# Patient Record
Sex: Female | Born: 1995 | Race: White | Hispanic: No | Marital: Single | Smoking: Never smoker
Health system: Southern US, Community
[De-identification: ages and names within clinical notes are randomized; demographics above are authoritative.]

---
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
## 2016-08-07 ENCOUNTER — Encounter (HOSPITAL_COMMUNITY): Payer: Self-pay | Admitting: Emergency Medicine

## 2016-08-07 ENCOUNTER — Emergency Department (HOSPITAL_COMMUNITY): Payer: BC Managed Care – PPO

## 2016-08-07 ENCOUNTER — Emergency Department (HOSPITAL_COMMUNITY)
Admission: EM | Admit: 2016-08-07 | Discharge: 2016-08-07 | Disposition: A | Discharge status: Home / Self Care | Payer: BC Managed Care – PPO | Attending: Emergency Medicine | Admitting: Emergency Medicine

## 2016-08-07 DIAGNOSIS — M79662 Pain in left lower leg: Secondary | ICD-10-CM | POA: Insufficient documentation

## 2016-08-07 DIAGNOSIS — Y939 Activity, unspecified: Secondary | ICD-10-CM | POA: Diagnosis not present

## 2016-08-07 DIAGNOSIS — W108XXA Fall (on) (from) other stairs and steps, initial encounter: Secondary | ICD-10-CM | POA: Insufficient documentation

## 2016-08-07 DIAGNOSIS — Z79899 Other long term (current) drug therapy: Secondary | ICD-10-CM | POA: Diagnosis not present

## 2016-08-07 DIAGNOSIS — R0781 Pleurodynia: Secondary | ICD-10-CM | POA: Insufficient documentation

## 2016-08-07 DIAGNOSIS — Y999 Unspecified external cause status: Secondary | ICD-10-CM | POA: Diagnosis not present

## 2016-08-07 DIAGNOSIS — S161XXA Strain of muscle, fascia and tendon at neck level, initial encounter: Secondary | ICD-10-CM | POA: Insufficient documentation

## 2016-08-07 DIAGNOSIS — Y92009 Unspecified place in unspecified non-institutional (private) residence as the place of occurrence of the external cause: Secondary | ICD-10-CM | POA: Insufficient documentation

## 2016-08-07 DIAGNOSIS — T07XXXA Unspecified multiple injuries, initial encounter: Secondary | ICD-10-CM

## 2016-08-07 DIAGNOSIS — S0990XA Unspecified injury of head, initial encounter: Secondary | ICD-10-CM | POA: Diagnosis present

## 2016-08-07 MED ORDER — HYDROCODONE-ACETAMINOPHEN 5-325 MG PO TABS
1.0000 | ORAL_TABLET | Freq: Once | ORAL | Status: AC
  Administered 2016-08-07: 1 via ORAL
  Filled 2016-08-07: qty 1

## 2016-08-07 MED ORDER — DIAZEPAM 5 MG PO TABS
5.0000 mg | ORAL_TABLET | Freq: Once | ORAL | Status: AC
  Administered 2016-08-07: 5 mg via ORAL
  Filled 2016-08-07: qty 1

## 2016-08-07 MED ORDER — ONDANSETRON HCL 4 MG PO TABS
4.0000 mg | ORAL_TABLET | Freq: Once | ORAL | Status: AC
  Administered 2016-08-07: 4 mg via ORAL
  Filled 2016-08-07: qty 1

## 2016-08-07 MED ORDER — CYCLOBENZAPRINE HCL 10 MG PO TABS: 10.0000 mg | ORAL_TABLET | Freq: Three times a day (TID) | ORAL | 0 refills | Status: AC

## 2016-08-07 MED ORDER — IBUPROFEN 600 MG PO TABS: 600.0000 mg | ORAL_TABLET | Freq: Four times a day (QID) | ORAL | 0 refills | Status: AC

## 2016-08-07 NOTE — ED Notes (Signed)
Pt placed in C-collar.  Sonia QualeHobson Bryant informed.

## 2016-08-07 NOTE — ED Triage Notes (Signed)
Pt reports tripped and fell down approximately 12 stairs. Pt reports neck pain,right sided extremity pain, generalized soreness and chest discomfort with deep breath and movement. nad noted. Pt alert and oriented. Pt denies loc.

## 2016-08-07 NOTE — Discharge Instructions (Signed)
Your vital signs within normal limits. Your oxygen level is 99% on room air. The CT scan of your head is negative for any bleed or swelling. The CT scan of your neck is negative for fracture or dislocation. The x-ray of your ribs and chest are negative for fracture or injury to the lung. The x-ray of your lower leg is negative for fracture or dislocation. Please use ibuprofen with breakfast, lunch, dinner, and at bedtime. Please use Flexeril 3 times daily for spasm pain.This medication may cause drowsiness. Please do not drink, drive, or participate in activity that requires concentration while taking this medication.

## 2016-08-07 NOTE — ED Provider Notes (Signed)
[facility]EMER[facility]ENCY DEPT Provider Note   CSN: [id number] Arrival date & time: 08/07/16  1012     History   Chief Complaint Chief Complaint  Patient presents with  . Fall    HPI AYLAH YEARY is a 20 y.o. female.  Patient is a 20 year old female who presents to the emergency department following a fall.  The patient states she fell down approximately 12 steps about 8:30 this morning. She states she tripped over her dog and sustained the injury. There was no loss of consciousness. No difficulty with breathing, no double vision, no vomiting, and no hemoptysis reported. There was no loss of bowel or bladder function. The patient does complain of feeling sleepy. She complains of neck pain, right flank area pain and left leg pain. She denies being on any anticoagulation medications. She's not had any recent operations or procedures. The patient is not taken any medication for this problem up to this point.   The history is provided by the patient.    History reviewed. No pertinent past medical history.  There are no active problems to display for this patient.   History reviewed. No pertinent surgical history.  OB History    No data available       Home Medications    Prior to Admission medications   Medication Sig Start Date End Date Taking? Authorizing Provider  buPROPion (WELLBUTRIN XL) 150 MG 24 hr tablet Take 1 tablet by mouth daily. 06/11/16  Yes Historical Provider, MD  escitalopram (LEXAPRO) 10 MG tablet Take 1 tablet by mouth daily. 06/11/16  Yes Historical Provider, MD    Family History History reviewed. No pertinent family history.  Social History Social History  Substance Use Topics  . Smoking status: Never Smoker  . Smokeless tobacco: Never Used  . Alcohol use No     Allergies   Solodyn [minocycline hcl er]   Review of Systems Review of Systems  Constitutional: Negative for activity change.       All ROS Neg except as noted in HPI  HENT: Negative  for nosebleeds.   Eyes: Negative for photophobia and discharge.  Respiratory: Negative for cough, shortness of breath and wheezing.   Cardiovascular: Negative for chest pain and palpitations.  Gastrointestinal: Negative for abdominal pain and blood in stool.  Genitourinary: Negative for dysuria, frequency and hematuria.  Musculoskeletal: Negative for arthralgias, back pain and neck pain.  Skin: Negative.   Neurological: Negative for dizziness, seizures and speech difficulty.  Psychiatric/Behavioral: Negative for confusion and hallucinations.  All other systems reviewed and are negative.    Physical Exam Updated Vital Signs BP 126/67 (BP Location: Right Wrist)   Pulse 64   Temp 98 F (36.7 C) (Oral)   Resp 16   Ht 5\' 6"  (1.676 m)   Wt 127 kg   LMP 07/20/2016   SpO2 99%   BMI 45.19 kg/m   Physical Exam  Constitutional: She is oriented to person, place, and time. She appears well-developed and well-nourished.  Non-toxic appearance.  HENT:  Head: Normocephalic.  Right Ear: Tympanic membrane and external ear normal.  Left Ear: Tympanic membrane and external ear normal.  Eyes: EOM and lids are normal. Pupils are equal, round, and reactive to light.  Neck: Normal range of motion. Neck supple. Carotid bruit is not present.  Cardiovascular: Normal rate, regular rhythm, normal heart sounds, intact distal pulses and normal pulses.   Pulmonary/Chest: Breath sounds normal. No respiratory distress.  The chest rises and falls symmetrically. There is  tenderness at the lower right posterior rib area. There is no hematoma appreciated. The patient speaks in complete sentences.  Abdominal: Soft. Bowel sounds are normal. There is no tenderness. There is no guarding.  Musculoskeletal: Normal range of motion.  There is pain and soreness of the paraspinal muscles in the cervical area. There is full range of motion of both shoulders, elbows, wrist and fingers. There is full range of motion of both  hips, and knees. There is pain of the left tibial area. There is no effusion of the left knee and no swelling noted. There is good range of motion of the left ankle and toes.  Lymphadenopathy:       Head (right side): No submandibular adenopathy present.       Head (left side): No submandibular adenopathy present.    She has no cervical adenopathy.  Neurological: She is alert and oriented to person, place, and time. She has normal strength. No cranial nerve deficit or sensory deficit. She exhibits normal muscle tone. Coordination normal.  Skin: Skin is warm and dry.  Psychiatric: She has a normal mood and affect. Her speech is normal.  Nursing note and vitals reviewed.    ED Treatments / Results  Labs (all labs ordered are listed, but only abnormal results are displayed) Labs Reviewed - No data to display  EKG  EKG Interpretation None       Radiology Dg Ribs Unilateral W/chest Right  Result Date: 08/07/2016 CLINICAL DATA:  Fall down 12 steps today.  Right lower rib pain. EXAM: RIGHT RIBS AND CHEST - 3+ VIEW COMPARISON:  None. FINDINGS: Normal heart size. Normal mediastinal contour. No pneumothorax. No pleural effusion. Lungs appear clear, with no acute consolidative airspace disease and no pulmonary edema. No fracture or suspicious focal osseous lesion is seen in the right ribs. IMPRESSION: No active disease in the chest. No right rib fracture detected. Should the patient's symptoms persist or worsen, repeat radiographs of the ribs in 10 - 14 days maybe of use to detect subtle nondisplaced rib fractures (which are commonly occult on initial imaging). Electronically Signed   By: Delbert Phenix M.D.   On: 08/07/2016 12:59   Dg Tibia/fibula Left  Result Date: 08/07/2016 CLINICAL DATA:  Fall. EXAM: LEFT TIBIA AND FIBULA - 2 VIEW COMPARISON:  No recent prior. FINDINGS: No acute bony or joint abnormality. No evidence of fracture or dislocation. IMPRESSION: No acute abnormality.  Electronically Signed   By: Maisie Fus  Register   On: 08/07/2016 12:56   Ct Head Wo Contrast  Result Date: 08/07/2016 CLINICAL DATA:  fell down 10-12 carpeted stairs at home. Pt says she stepped on dogs tail and tumbled forward down stairs hitting back of head (Feeling sleepy), but did not loss LOC. C/o right side, back, neck and left leg pain. Pt says she feels sleepy. Having numbness to left leg and not able to lift off stretcher. EXAM: CT HEAD WITHOUT CONTRAST CT CERVICAL SPINE WITHOUT CONTRAST TECHNIQUE: Multidetector CT imaging of the head and cervical spine was performed following the standard protocol without intravenous contrast. Multiplanar CT image reconstructions of the cervical spine were also generated. COMPARISON:  None. FINDINGS: CT HEAD FINDINGS Brain: No evidence of acute infarction, hemorrhage, hydrocephalus, extra-axial collection or mass lesion/mass effect. Vascular: No hyperdense vessel or unexpected calcification. Skull: Normal. Negative for fracture or focal lesion. Sinuses/Orbits: Low-attenuation mucoperiosteal thickening or debris in the left maxillary sinus. Orbits unremarkable. Other: None. CT CERVICAL SPINE FINDINGS Alignment: Straightening of the normal cervical  lordosis. No dislocation. Skull base and vertebrae: No acute fracture. No primary bone lesion or focal pathologic process. Soft tissues and spinal canal: No prevertebral fluid or swelling. No visible canal hematoma. Disc levels: No narrowing of the interspaces. No conspicuous protrusion or other acute finding. Upper chest: Negative. Other: None IMPRESSION: 1. Negative for bleed or other acute intracranial process. 2. Negative for cervical spine fracture or other acute bone abnormality. 3. Loss of the normal cervical spine lordosis, which may be secondary to positioning, spasm, or soft tissue injury. 4. Left maxillary sinus disease. Electronically Signed   By: Corlis Leak  Hassell M.D.   On: 08/07/2016 13:27   Ct Cervical Spine Wo  Contrast  Result Date: 08/07/2016 CLINICAL DATA:  fell down 10-12 carpeted stairs at home. Pt says she stepped on dogs tail and tumbled forward down stairs hitting back of head (Feeling sleepy), but did not loss LOC. C/o right side, back, neck and left leg pain. Pt says she feels sleepy. Having numbness to left leg and not able to lift off stretcher. EXAM: CT HEAD WITHOUT CONTRAST CT CERVICAL SPINE WITHOUT CONTRAST TECHNIQUE: Multidetector CT imaging of the head and cervical spine was performed following the standard protocol without intravenous contrast. Multiplanar CT image reconstructions of the cervical spine were also generated. COMPARISON:  None. FINDINGS: CT HEAD FINDINGS Brain: No evidence of acute infarction, hemorrhage, hydrocephalus, extra-axial collection or mass lesion/mass effect. Vascular: No hyperdense vessel or unexpected calcification. Skull: Normal. Negative for fracture or focal lesion. Sinuses/Orbits: Low-attenuation mucoperiosteal thickening or debris in the left maxillary sinus. Orbits unremarkable. Other: None. CT CERVICAL SPINE FINDINGS Alignment: Straightening of the normal cervical lordosis. No dislocation. Skull base and vertebrae: No acute fracture. No primary bone lesion or focal pathologic process. Soft tissues and spinal canal: No prevertebral fluid or swelling. No visible canal hematoma. Disc levels: No narrowing of the interspaces. No conspicuous protrusion or other acute finding. Upper chest: Negative. Other: None IMPRESSION: 1. Negative for bleed or other acute intracranial process. 2. Negative for cervical spine fracture or other acute bone abnormality. 3. Loss of the normal cervical spine lordosis, which may be secondary to positioning, spasm, or soft tissue injury. 4. Left maxillary sinus disease. Electronically Signed   By: Corlis Leak  Hassell M.D.   On: 08/07/2016 13:27    Procedures Procedures (including critical care time)  Medications Ordered in ED Medications  diazepam  (VALIUM) tablet 5 mg (5 mg Oral Given 08/07/16 1217)  HYDROcodone-acetaminophen (NORCO/VICODIN) 5-325 MG per tablet 1 tablet (1 tablet Oral Given 08/07/16 1217)  ondansetron (ZOFRAN) tablet 4 mg (4 mg Oral Given 08/07/16 1217)     Initial Impression / Assessment and Plan / ED Course  I have reviewed the triage vital signs and the nursing notes.  Pertinent labs & imaging results that were available during my care of the patient were reviewed by me and considered in my medical decision making (see chart for details).  Clinical Course     **I have reviewed nursing notes, vital signs, and all appropriate lab and imaging results for this patient.*  Final Clinical Impressions(s) / ED Diagnoses Pulse oximetry is 99% on room air. Vital signs are well within normal limits. CT scan of the head is negative for acute problem. CT scan of the cervical spine show some loss of lordotic curve, but no fracture or dislocation. X-ray of the right ribs and chest is negative for fracture or acute injury. X-ray of the left tibial area is negative for fracture or dislocation.  I discussed these findings with the patient in terms which he understands. The examination favors contusion of multiple sites, and muscle strain of multiple sites. The patient will be treated with Flexeril 3 times daily, and ibuprofen 4 times daily. Patient will return to the emergency department if any changes, problems, or concerns.    Final diagnoses:  None    New Prescriptions New Prescriptions   CYCLOBENZAPRINE (FLEXERIL) 10 MG TABLET    Take 1 tablet (10 mg total) by mouth 3 (three) times daily.   IBUPROFEN (ADVIL,MOTRIN) 600 MG TABLET    Take 1 tablet (600 mg total) by mouth 4 (four) times daily.     Ivery QualeHobson Jvion Turgeon, PA-C 08/07/16 1427    Loren Raceravid Yelverton, MD 08/09/16 1409

## 2016-08-07 NOTE — ED Notes (Signed)
Consulted EDP concerning pt's symptoms and chief complaint. EDP reported would be in to see pt. No orders given at this time.

## 2016-08-07 NOTE — ED Notes (Signed)
Pt says she fell down 10-12 carpeted stairs at home.  Pt says she stepped on dogs tail and tumbled forward down stairs hitting back of head (Feeling sleepy), but did not loss LOC.  C/o right side, back, neck and left leg pain.  Pt says she feels sleepy.  Having numbness to left leg and not able to lift off stretcher.  Victorino DikeJennifer, RN charge nurse, informed.

## 2018-04-16 IMAGING — DX DG RIBS W/ CHEST 3+V*R*
4 series · 4 of 4 positions shown · non-contrast
Comparison: None.

CLINICAL DATA: Fall down 12 steps today.  Right lower rib pain.

EXAM:
RIGHT RIBS AND CHEST - 3+ VIEW

[chest pa]
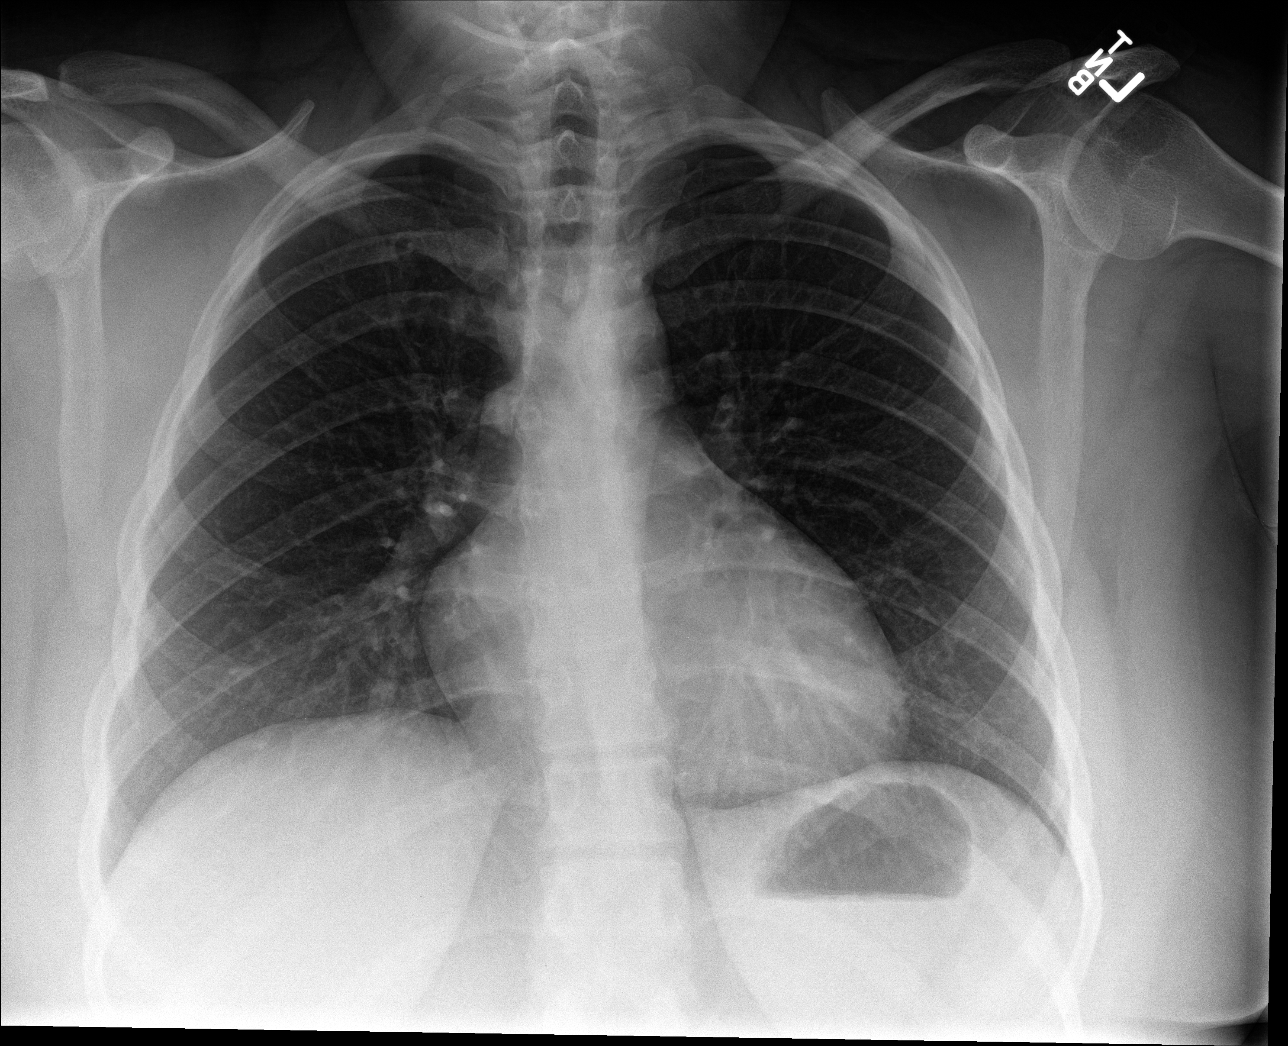

[rib pa]
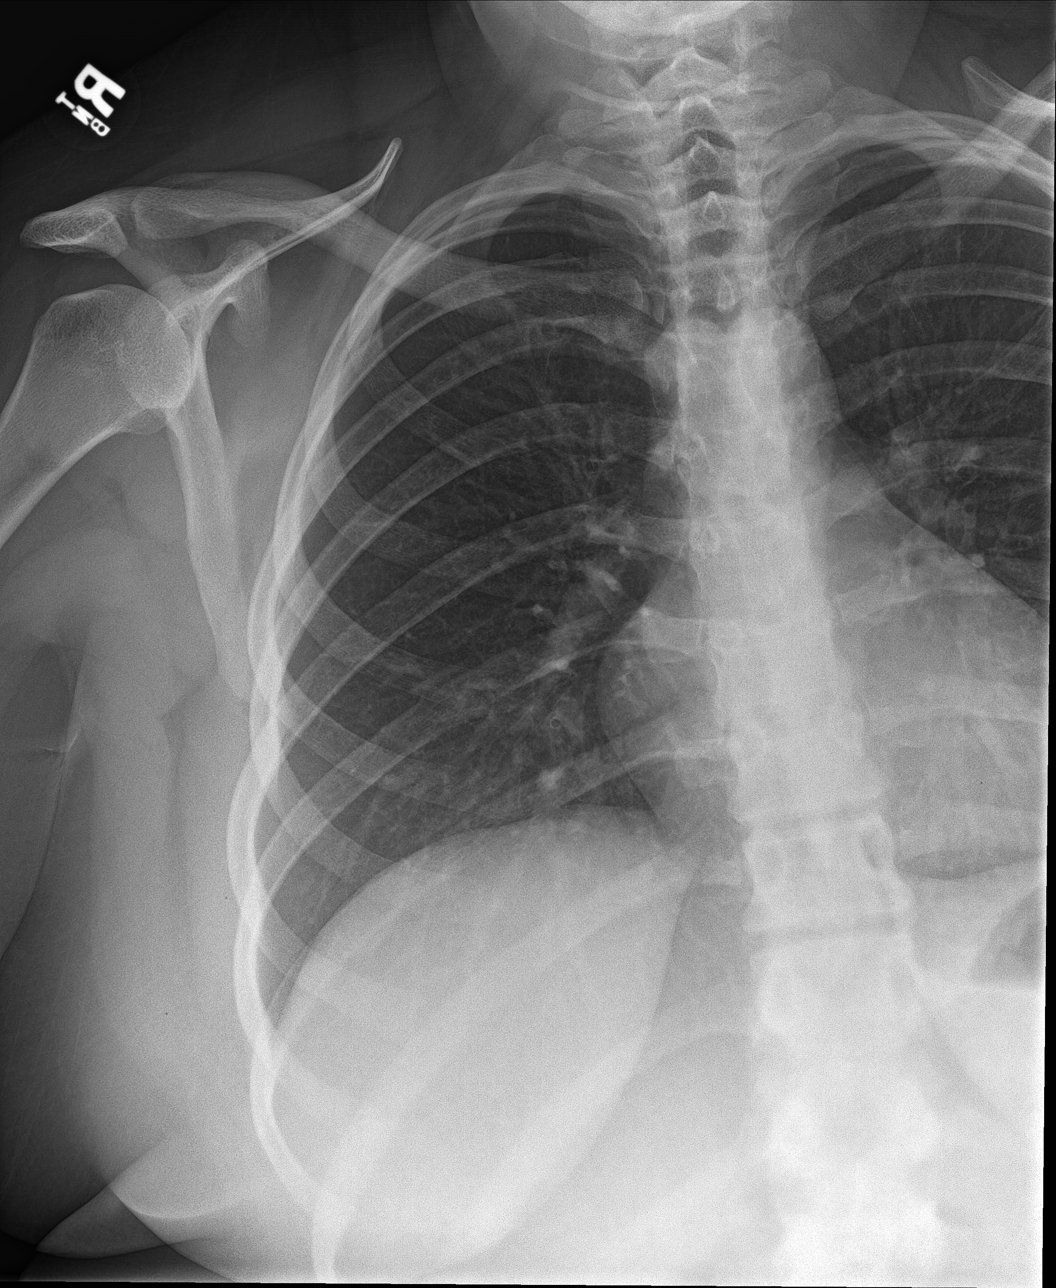

[rib obl (1 of 2)]
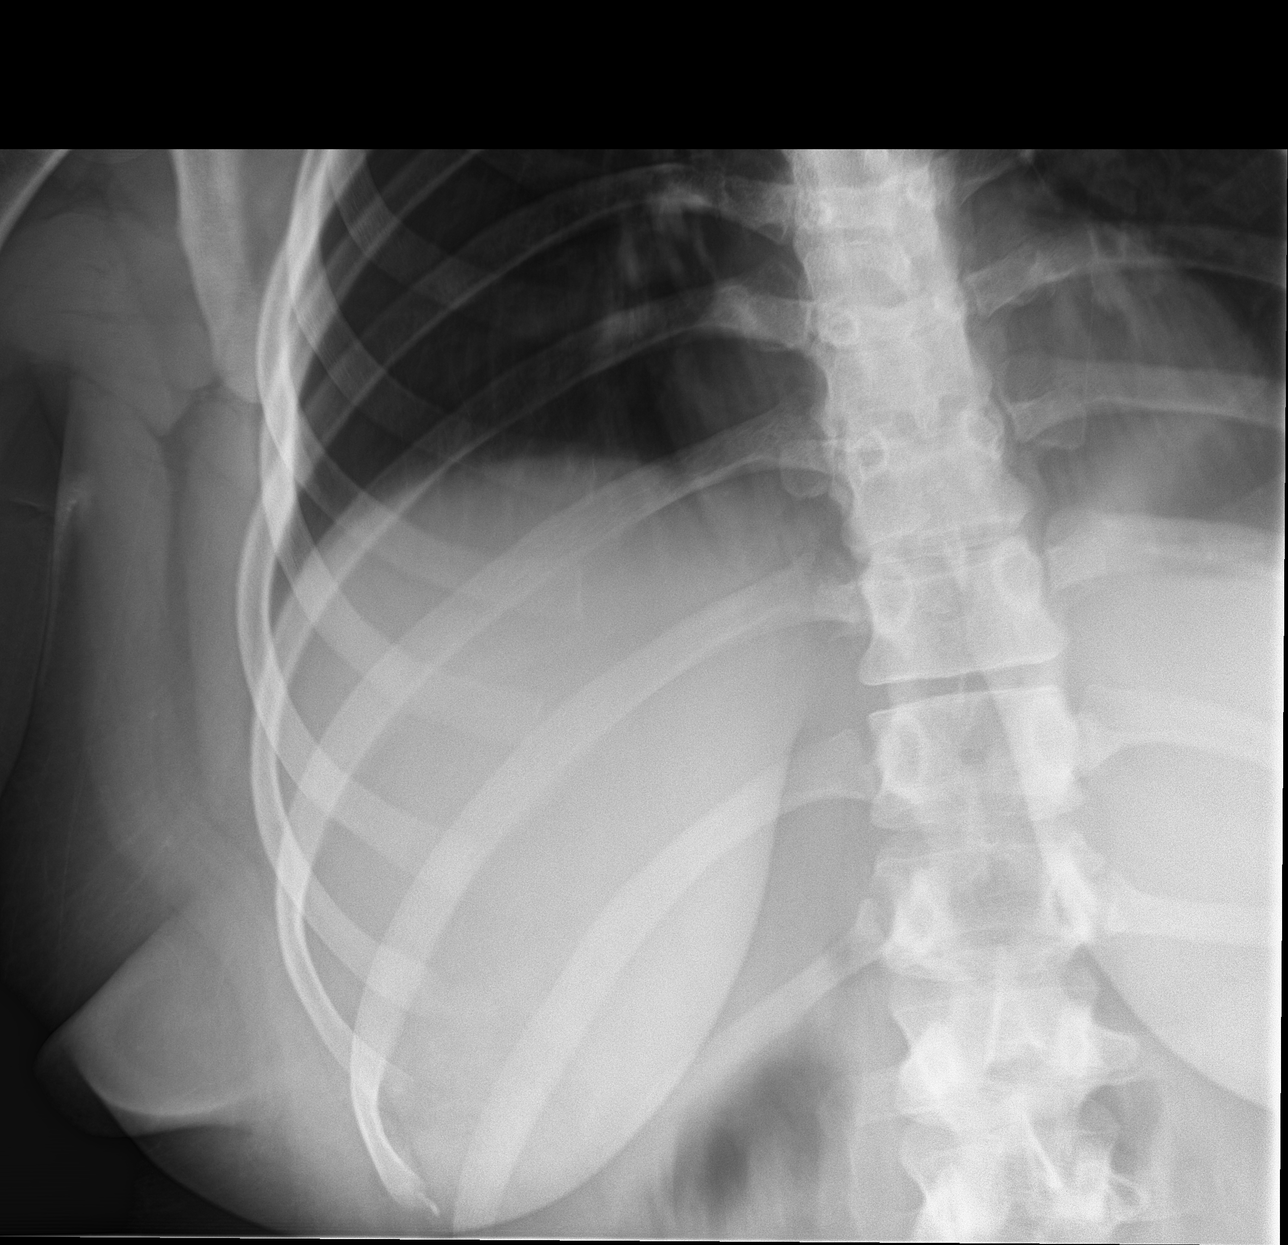

[rib obl (2 of 2)]
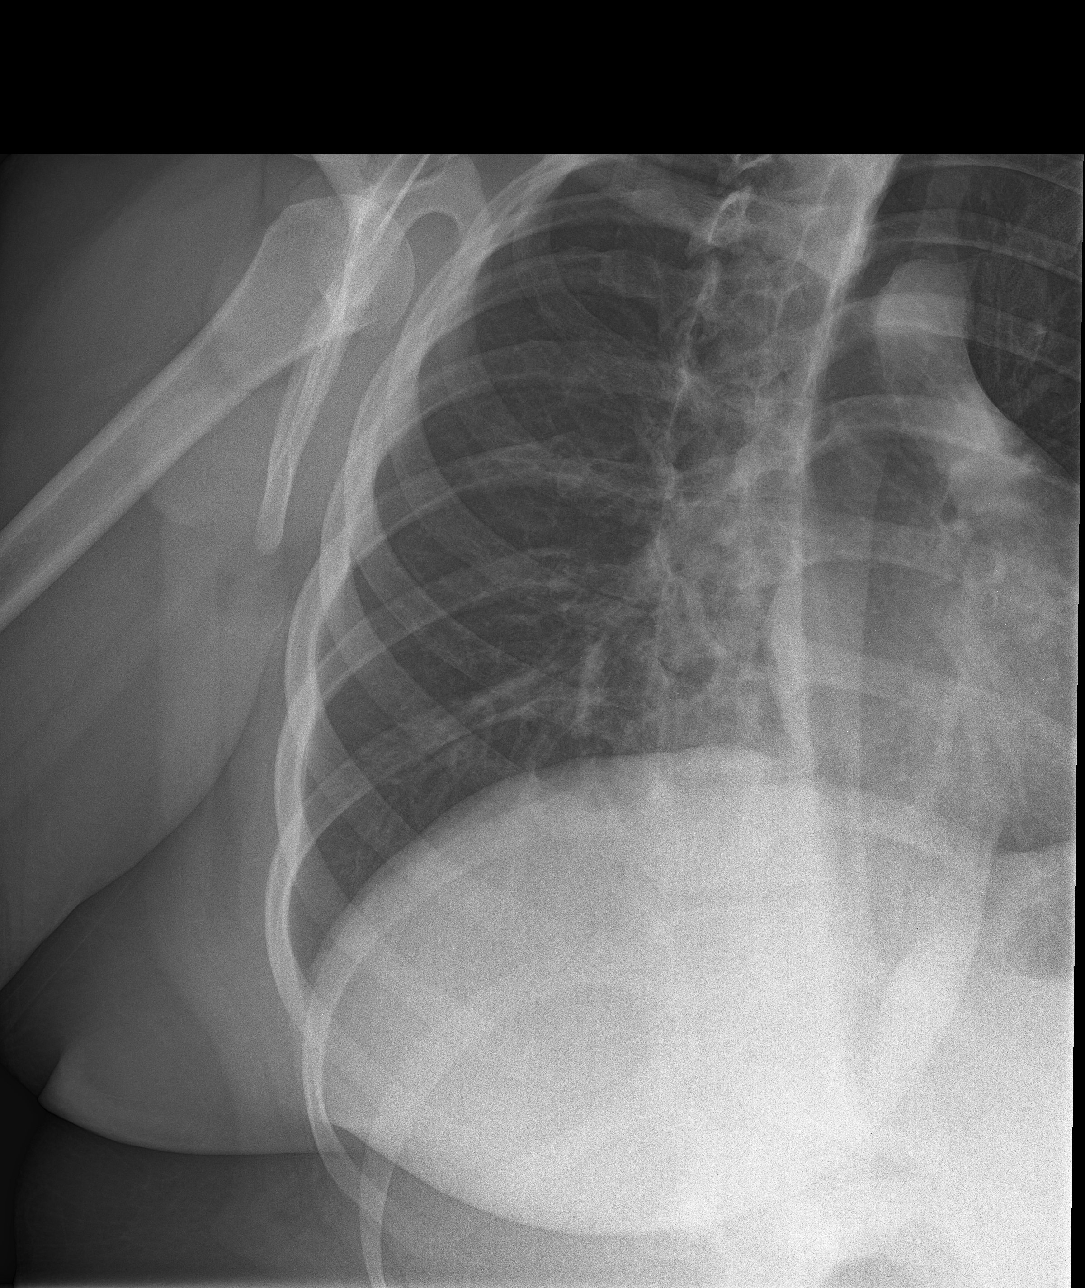

[4 of 4 positions shown; findings below may reference images not displayed]

FINDINGS: Normal heart size. Normal mediastinal contour. No pneumothorax. No
pleural effusion. Lungs appear clear, with no acute consolidative
airspace disease and no pulmonary edema. No fracture or suspicious
focal osseous lesion is seen in the right ribs.
IMPRESSION: No active disease in the chest. No right rib fracture detected.
Should the patient's symptoms persist or worsen, repeat radiographs
of the ribs in 10 - 14 days maybe of use to detect subtle
nondisplaced rib fractures (which are commonly occult on initial
imaging).

## 2023-08-10 ENCOUNTER — Telehealth: Payer: Self-pay

## 2023-08-10 ENCOUNTER — Ambulatory Visit: Payer: Self-pay

## 2023-08-10 ENCOUNTER — Ambulatory Visit
Admission: EM | Admit: 2023-08-10 | Discharge: 2023-08-10 | Disposition: A | Discharge status: Home / Self Care | Payer: Self-pay

## 2023-08-10 DIAGNOSIS — H1033 Unspecified acute conjunctivitis, bilateral: Secondary | ICD-10-CM

## 2023-08-10 DIAGNOSIS — J22 Unspecified acute lower respiratory infection: Secondary | ICD-10-CM

## 2023-08-10 LAB — POCT URINE PREGNANCY: Preg Test, Ur: NEGATIVE

## 2023-08-10 MED ORDER — AZITHROMYCIN 250 MG PO TABS: ORAL_TABLET | ORAL | 0 refills | Status: AC

## 2023-08-10 MED ORDER — PREDNISONE 20 MG PO TABS: 40.0000 mg | ORAL_TABLET | Freq: Every day | ORAL | 0 refills | Status: AC

## 2023-08-10 NOTE — Discharge Instructions (Addendum)
I have sent in an antibiotic and steroid.  This antibiotic would cover for atypical forms of pneumonia in case this is why you have not been improving previously.  Your chest x-ray should be back in the next few hours, we will call if anything changes based on this result.  The report will also be available through your MyChart.  You may continue over-the-counter remedies such as Coricidin HBP, Flonase, lubricating eyedrops.  Your conjunctivitis is likely viral but the antibiotic would cover for any bacterial causes.

## 2023-08-10 NOTE — Telephone Encounter (Signed)
Contacted pt to notify her of Chest x-ray results and medication recommendations pt has verbalized understanding.

## 2023-08-10 NOTE — ED Triage Notes (Signed)
Pt reports x 2 weeks ago was seen in urgent care on a Sunday for cough and nasal congestion,chest pain. Was diagnosed with bronchitis. Treated, then went to the the ED for Elevated BP, this worried her due to fathers history of HBP and massive heart attack 2 years ago. was told elevated BP was possibly  due to cough medication prescribed to was placed on Ashwaganda, mom told her she needed to come in for chest x-ray to rule out walking pneumonia, pt has tried OTC meds prescribed meds neither have helped with cough.

## 2023-08-14 NOTE — ED Provider Notes (Addendum)
[facility]    CSN: [id number] Arrival date & time: 08/10/23  1050      History   Chief Complaint No chief complaint on file.   HPI Sonia English is a 27 y.o. female.   Patient presenting today with 2 to 3-week history of ongoing congestion, cough, chest pain, shortness of breath.  Was seen about 2 weeks ago at an urgent care, diagnosed with bronchitis and treated with steroids and antibiotics with no resolution.  Continuing to take over-the-counter cold and congestion medications with no relief.  Denies fever, chills, body aches, abdominal pain, nausea vomiting or diarrhea.  Denies known history of chronic pulmonary disease.    History reviewed. No pertinent past medical history.  There are no problems to display for this patient.   History reviewed. No pertinent surgical history.  OB History   No obstetric history on file.      Home Medications    Prior to Admission medications   Medication Sig Start Date End Date Taking? Authorizing Provider  azithromycin (ZITHROMAX) 250 MG tablet Take first 2 tablets together, then 1 every day until finished. 08/10/23  Yes Particia Nearing, PA-C  metFORMIN (GLUCOPHAGE-XR) 500 MG 24 hr tablet Take 500 mg by mouth daily with breakfast. 02/11/23  Yes [provider]  norethindrone (MICRONOR) 0.35 MG tablet Take 1 tablet by mouth daily. 09/03/22  Yes [provider]  predniSONE (DELTASONE) 20 MG tablet Take 2 tablets (40 mg total) by mouth daily with breakfast. 08/10/23  Yes Particia Nearing, PA-C  buPROPion (WELLBUTRIN XL) 150 MG 24 hr tablet Take 1 tablet by mouth daily. 06/11/16   [provider]  cyclobenzaprine (FLEXERIL) 10 MG tablet Take 1 tablet (10 mg total) by mouth 3 (three) times daily. 08/07/16   Ivery Quale, PA-C  escitalopram (LEXAPRO) 10 MG tablet Take 1 tablet by mouth daily. 06/11/16   [provider]  ibuprofen (ADVIL,MOTRIN) 600 MG tablet Take 1 tablet  (600 mg total) by mouth 4 (four) times daily. 08/07/16   Ivery Quale, PA-C    Family History History reviewed. No pertinent family history.  Social History Social History   Tobacco Use   Smoking status: Never   Smokeless tobacco: Never  Substance Use Topics   Alcohol use: No   Drug use: No     Allergies   Solodyn [minocycline hcl er] and Bupropion   Review of Systems Review of Systems Per HPI  Physical Exam Triage Vital Signs ED Triage Vitals  Encounter Vitals Group     BP 08/10/23 1245 115/77     Systolic BP Percentile --      Diastolic BP Percentile --      Pulse Rate 08/10/23 1245 93     Resp 08/10/23 1245 18     Temp 08/10/23 1245 98.9 F (37.2 C)     Temp Source 08/10/23 1245 Oral     SpO2 08/10/23 1245 92 %     Weight --      Height --      Head Circumference --      Peak Flow --      Pain Score 08/10/23 1247 1     Pain Loc --      Pain Education --      Exclude from Growth Chart --    No data found.  Updated Vital Signs BP 115/77 (BP Location: Right Arm)   Pulse 93   Temp 98.9 F (37.2 C) (Oral)  Resp 18   LMP 06/29/2023 (Within Weeks)   SpO2 92%   Visual Acuity Right Eye Distance:   Left Eye Distance:   Bilateral Distance:    Right Eye Near:   Left Eye Near:    Bilateral Near:     Physical Exam Vitals and nursing note reviewed.  Constitutional:      Appearance: Normal appearance.  HENT:     Head: Atraumatic.     Right Ear: Tympanic membrane and external ear normal.     Left Ear: Tympanic membrane and external ear normal.     Nose: Congestion present.     Mouth/Throat:     Mouth: Mucous membranes are moist.     Pharynx: Posterior oropharyngeal erythema present.  Eyes:     Extraocular Movements: Extraocular movements intact.     Pupils: Pupils are equal, round, and reactive to light.     Comments: Bilateral conjunctival erythema  Cardiovascular:     Rate and Rhythm: Normal rate and regular rhythm.     Heart sounds: Normal  heart sounds.  Pulmonary:     Effort: Pulmonary effort is normal.     Breath sounds: Wheezing present. No rales.     Comments: Trace diffuse wheezes Musculoskeletal:        General: Normal range of motion.     Cervical back: Normal range of motion and neck supple.  Skin:    General: Skin is warm and dry.  Neurological:     Mental Status: She is alert and oriented to person, place, and time.  Psychiatric:        Mood and Affect: Mood normal.        Thought Content: Thought content normal.      UC Treatments / Results  Labs (all labs ordered are listed, but only abnormal results are displayed) Labs Reviewed  POCT URINE PREGNANCY    EKG   Radiology No results found.  Procedures Procedures (including critical care time)  Medications Ordered in UC Medications - No data to display  Initial Impression / Assessment and Plan / UC Course  I have reviewed the triage vital signs and the nursing notes.  Pertinent labs & imaging results that were available during my care of the patient were reviewed by me and considered in my medical decision making (see chart for details).     Vital signs within normal limits but oxygen saturation only 92% on room air.  She is breathing comfortably and speaking in English sentences.  Chest x-ray performed today, negative for pneumonia.  Given duration and worsening course will cover for atypical bacterial respiratory infection with Zithromax, prednisone for wheezing and chest tightness.  Discussed with patient that the Zithromax would cover for a possible bacterial conjunctivitis so no need for eyedrops at this time.  Suspect it is more viral in nature given the appearance.  Discussed supportive over-the-counter medications and home care.  Final Clinical Impressions(s) / UC Diagnoses   Final diagnoses:  Lower respiratory infection  Acute conjunctivitis of both eyes, unspecified acute conjunctivitis type     Discharge Instructions      I have  sent in an antibiotic and steroid.  This antibiotic would cover for atypical forms of pneumonia in case this is why you have not been improving previously.  Your chest x-ray should be back in the next few hours, we will call if anything changes based on this result.  The report will also be available through your MyChart.  You may continue  over-the-counter remedies such as Coricidin HBP, Flonase, lubricating eyedrops.  Your conjunctivitis is likely viral but the antibiotic would cover for any bacterial causes.    ED Prescriptions     Medication Sig Dispense Auth. Provider   azithromycin (ZITHROMAX) 250 MG tablet Take first 2 tablets together, then 1 every day until finished. 6 tablet Particia Nearing, [place]   predniSONE (DELTASONE) 20 MG tablet Take 2 tablets (40 mg total) by mouth daily with breakfast. 10 tablet Particia Nearing, [place]      PDMP not reviewed this encounter.   Particia Nearing, [place] 08/14/23 0819    Particia Nearing, PA-C 08/14/23 [phone]
